# Patient Record
Sex: Male | Born: 1965 | Race: White | Hispanic: No | Marital: Married | Smoking: Never smoker
Health system: Southern US, Community
[De-identification: ages and names within clinical notes are randomized; demographics above are authoritative.]

## PROBLEM LIST (undated history)

## (undated) DIAGNOSIS — E785 Hyperlipidemia, unspecified: Secondary | ICD-10-CM

## (undated) DIAGNOSIS — G4730 Sleep apnea, unspecified: Secondary | ICD-10-CM

## (undated) HISTORY — PX: UVULOPALATOPHARYNGOPLASTY: SHX827

## (undated) HISTORY — PX: TONSILLECTOMY: SUR1361

## (undated) HISTORY — DX: Sleep apnea, unspecified: G47.30

## (undated) HISTORY — DX: Hyperlipidemia, unspecified: E78.5

## (undated) HISTORY — PX: TOOTH EXTRACTION: SUR596

---

## 2008-03-16 ENCOUNTER — Ambulatory Visit: Payer: Self-pay | Admitting: Dermatology

## 2010-01-11 ENCOUNTER — Emergency Department: Payer: Self-pay | Admitting: Emergency Medicine

## 2010-10-05 ENCOUNTER — Ambulatory Visit: Payer: Self-pay | Admitting: Otolaryngology

## 2011-08-09 DIAGNOSIS — R7989 Other specified abnormal findings of blood chemistry: Secondary | ICD-10-CM | POA: Insufficient documentation

## 2011-08-09 DIAGNOSIS — G4700 Insomnia, unspecified: Secondary | ICD-10-CM | POA: Insufficient documentation

## 2012-03-10 ENCOUNTER — Ambulatory Visit: Payer: Self-pay | Admitting: Otolaryngology

## 2012-10-03 DIAGNOSIS — G4733 Obstructive sleep apnea (adult) (pediatric): Secondary | ICD-10-CM | POA: Insufficient documentation

## 2014-06-11 NOTE — Op Note (Signed)
PATIENT NAME:  Chad MessingDWARDS, Malcolm B MR#:  [id number] DATE OF BIRTH:  18-Sep-1965  DATE OF PROCEDURE:  03/10/2012  PREOPERATIVE DIAGNOSES: 1. Airway obstruction.  2. Turbinate hypertrophy.  3. Tonsillar hypertrophy.  4. Left scalp lesion.   POSTOPERATIVE DIAGNOSES: 1. Airway obstruction.  2. Turbinate hypertrophy.  3. Tonsillar hypertrophy.  4. Left scalp lesion.   OPERATIVE PROCEDURE:  1. Tonsillectomy.  2. Bilateral partial reduction of the inferior turbinates.  3. Excision of left scalp lesion, 2 x 3 cm, with advancement flap closure.   SURGEON: Vernie MurdersPaul Shakeena Kafer, MD   ANESTHESIA: General.   COMPLICATIONS: None.   DESCRIPTION OF PROCEDURE: The patient was given general anesthesia by oral endotracheal intubation. A Davis mouth gag was used to visualize the oropharynx. The tonsils were quite large, but they were buried behind large anterior tonsillar pillars. The palate was long with the uvula enlarged. The tonsils were grasped and pulled medially, the anterior pillar incised. The left one looked like it had previous abscess in it because it was all scarred down inferiorly. Once the tonsils were removed, bleeding was controlled with electrocautery. The posterior tonsillar pillars were hypertrophied, and incision was made vertical into the palate and posterior tonsillar pillar. This was then rotated laterally and sutured to the anterior tonsillar pillar to help close the superior half of the tonsillar fossa. This stretched the palate and helped hold it forward as well. This was done on both sides to anchor it there with several mattress sutures bilaterally. The remaining uvula was trimmed some of its inferior border. There was good opening into the nasopharynx and plenty of soft tissue I could elevate posteriorly and close the nasopharynx.   The nose was prepped using 5 mL of 1% Xylocaine with epinephrine 1:200,000 to infiltrate into the inferior turbinates.  The septum was relatively straight.  The turbinates were cauterized along their anterior-inferior border and were trimmed using Gruenwald forceps of some of the redundant tissue and bone. Bleeding was controlled with electrocautery along the trimmed edge. The inferior turbinates were out-fractured, the remaining portion of them, to lateralize now and have more room to sit at the floor of the nose laterally. This opened up the airway on both sides, and I could see the nasopharynx better from here. Bleeding was controlled with electrocautery. No packing was placed.   The left scalp was prepared by prepping with Betadine, and 5 mL of 1% Xylocaine with epinephrine 1:200,000 were used for infiltration in the scalp as well. This was allowed to sit for over 10 minutes. An elliptical incision was created around the lesion without shaving any hair. An ellipse of tissue that was 3 cm in length and about 2 cm in width was removed. This was a raised lesion of the scalp. The full thickness of skin and subcutaneous was removed down to the galea. Bleeding was controlled with electrocautery. The skin and subcutaneous was elevated on both sides in the subgaleal plane. This allowed mobility of both flaps. The flaps were elevated and advanced to the midline, and 3-0 Vicryls were used, interrupted sutures, for pulling together the deep tissues, taking the tension off the wound edge. The wound was then closed using 5-0 Prolene in a running locking suture. This was washed, and Neosporin ointment was placed over the top of it. The patient tolerated the procedure well. He was awakened and taken to the recovery room in satisfactory condition. There were no operative complications.   ____________________________ Cammy CopaPaul H. Aiyanna Awtrey, MD phj:cb D: 03/10/2012 12:26:51 ET  T: 03/10/2012 12:53:53 ET JOB#: [id number]  cc: Cammy Copa, MD, <Dictator> Cammy Copa MD ELECTRONICALLY SIGNED 03/17/2012 7:27

## 2014-07-03 ENCOUNTER — Emergency Department
Admission: EM | Admit: 2014-07-03 | Discharge: 2014-07-03 | Disposition: A | Discharge status: Home / Self Care | Payer: BC Managed Care – PPO | Attending: Emergency Medicine | Admitting: Emergency Medicine

## 2014-07-03 DIAGNOSIS — Y9389 Activity, other specified: Secondary | ICD-10-CM | POA: Insufficient documentation

## 2014-07-03 DIAGNOSIS — Y9289 Other specified places as the place of occurrence of the external cause: Secondary | ICD-10-CM | POA: Diagnosis not present

## 2014-07-03 DIAGNOSIS — X58XXXA Exposure to other specified factors, initial encounter: Secondary | ICD-10-CM | POA: Insufficient documentation

## 2014-07-03 DIAGNOSIS — L500 Allergic urticaria: Secondary | ICD-10-CM | POA: Insufficient documentation

## 2014-07-03 DIAGNOSIS — R220 Localized swelling, mass and lump, head: Secondary | ICD-10-CM | POA: Insufficient documentation

## 2014-07-03 DIAGNOSIS — T50905A Adverse effect of unspecified drugs, medicaments and biological substances, initial encounter: Secondary | ICD-10-CM

## 2014-07-03 DIAGNOSIS — Y998 Other external cause status: Secondary | ICD-10-CM | POA: Diagnosis not present

## 2014-07-03 DIAGNOSIS — T7840XA Allergy, unspecified, initial encounter: Secondary | ICD-10-CM | POA: Diagnosis present

## 2014-07-03 DIAGNOSIS — R202 Paresthesia of skin: Secondary | ICD-10-CM | POA: Insufficient documentation

## 2014-07-03 DIAGNOSIS — T450X5A Adverse effect of antiallergic and antiemetic drugs, initial encounter: Secondary | ICD-10-CM | POA: Insufficient documentation

## 2014-07-03 MED ORDER — DIPHENHYDRAMINE HCL 50 MG/ML IJ SOLN
INTRAMUSCULAR | Status: AC
  Administered 2014-07-03: 25 mg via INTRAVENOUS
  Filled 2014-07-03: qty 1

## 2014-07-03 MED ORDER — METHYLPREDNISOLONE SODIUM SUCC 125 MG IJ SOLR
125.0000 mg | Freq: Once | INTRAMUSCULAR | Status: AC
  Administered 2014-07-03: 125 mg via INTRAVENOUS

## 2014-07-03 MED ORDER — PREDNISONE 20 MG PO TABS
ORAL_TABLET | ORAL | Status: AC
  Administered 2014-07-03: 40 mg via ORAL
  Filled 2014-07-03: qty 2

## 2014-07-03 MED ORDER — PREDNISONE 20 MG PO TABS: 40.0000 mg | ORAL_TABLET | Freq: Every day | ORAL | Status: DC

## 2014-07-03 MED ORDER — FAMOTIDINE IN NACL 20-0.9 MG/50ML-% IV SOLN
20.0000 mg | Freq: Once | INTRAVENOUS | Status: AC
  Administered 2014-07-03: 20 mg via INTRAVENOUS

## 2014-07-03 MED ORDER — DIPHENHYDRAMINE HCL 50 MG/ML IJ SOLN
25.0000 mg | Freq: Once | INTRAMUSCULAR | Status: AC
  Administered 2014-07-03: 25 mg via INTRAVENOUS

## 2014-07-03 MED ORDER — EPINEPHRINE 0.3 MG/0.3ML IJ SOAJ: 0.3000 mg | Freq: Once | INTRAMUSCULAR | Status: AC

## 2014-07-03 MED ORDER — FAMOTIDINE IN NACL 20-0.9 MG/50ML-% IV SOLN
INTRAVENOUS | Status: AC
  Administered 2014-07-03: 20 mg via INTRAVENOUS
  Filled 2014-07-03: qty 50

## 2014-07-03 MED ORDER — SODIUM CHLORIDE 0.9 % IV BOLUS (SEPSIS)
1000.0000 mL | Freq: Once | INTRAVENOUS | Status: AC
  Administered 2014-07-03: 1000 mL via INTRAVENOUS

## 2014-07-03 MED ORDER — METHYLPREDNISOLONE SODIUM SUCC 125 MG IJ SOLR
INTRAMUSCULAR | Status: AC
  Administered 2014-07-03: 125 mg via INTRAVENOUS
  Filled 2014-07-03: qty 2

## 2014-07-03 MED ORDER — PREDNISONE 20 MG PO TABS
40.0000 mg | ORAL_TABLET | Freq: Once | ORAL | Status: AC
Start: 2014-07-03 — End: 2014-07-03
  Administered 2014-07-03: 40 mg via ORAL

## 2014-07-03 NOTE — ED Notes (Signed)
Pt reports playing ball on the ballfield with his kids when he began to feel his lips tingle. Pt went home, took shower, and took 50 mg benadryl PO. When pt got out of the shower he felt as though his tongue was swelling. Upon arrival pt has diffuse hives, pt reports his upper lip feels "thick" Pt speaks full sentences, has no wheezing, can handle his own secretions, and has no visible swelling of his tongue.

## 2014-07-03 NOTE — Discharge Instructions (Signed)
Angioedema °Angioedema is a sudden swelling of tissues, often of the skin. It can occur on the face or genitals or in the abdomen or other body parts. The swelling usually develops over a short period and gets better in 24 to 48 hours. It often begins during the night and is found when the person wakes up. The person may also get red, itchy patches of skin (hives). Angioedema can be dangerous if it involves swelling of the air passages.  °Depending on the cause, episodes of angioedema may only happen once, come back in unpredictable patterns, or repeat for several years and then gradually fade away.  °CAUSES  °Angioedema can be caused by an allergic reaction to various triggers. It can also result from nonallergic causes, including reactions to drugs, immune system disorders, viral infections, or an abnormal gene that is passed to you from your parents (hereditary). For some people with angioedema, the cause is unknown.  °Some things that can trigger angioedema include:  °· Foods.   °· Medicines, such as ACE inhibitors, ARBs, nonsteroidal anti-inflammatory agents, or estrogen.   °· Latex.   °· Animal saliva.   °· Insect stings.   °· Dyes used in X-rays.   °· Mild injury.   °· Dental work. °· Surgery. °· Stress.   °· Sudden changes in temperature.   °· Exercise. °SIGNS AND SYMPTOMS  °· Swelling of the skin. °· Hives. If these are present, there is also intense itching. °· Redness in the affected area.   °· Pain in the affected area. °· Swollen lips or tongue. °· Breathing problems. This may happen if the air passages swell. °· Wheezing. °If internal organs are involved, there may be:  °· Nausea.   °· Abdominal pain.   °· Vomiting.   °· Difficulty swallowing.   °· Difficulty passing urine. °DIAGNOSIS  °· Your health care provider will examine the affected area and take a medical and family history. °· Various tests may be done to help determine the cause. Tests may include: °¨ Allergy skin tests to see if the problem  is an allergic reaction.   °¨ Blood tests to check for hereditary angioedema.   °¨ Tests to check for underlying diseases that could cause the condition.   °· A review of your medicines, including over-the-counter medicines, may be done. °TREATMENT  °Treatment will depend on the cause of the angioedema. Possible treatments include:  °· Removal of anything that triggered the condition (such as stopping certain medicines).   °· Medicines to treat symptoms or prevent attacks. Medicines given may include:   °¨ Antihistamines.   °¨ Epinephrine injection.   °¨ Steroids.   °· Hospitalization may be required for severe attacks. If the air passages are affected, it can be an emergency. Tubes may need to be placed to keep the airway open. °HOME CARE INSTRUCTIONS  °· Take all medicines as directed by your health care provider. °· If you were given medicines for emergency allergy treatment, always carry them with you. °· Wear a medical bracelet as directed by your health care provider.   °· Avoid known triggers. °SEEK MEDICAL CARE IF:  °· You have repeat attacks of angioedema.   °· Your attacks are more frequent or more severe despite preventive measures.   °· You have hereditary angioedema and are considering having children. It is important to discuss with your health care provider the risks of passing the condition on to your children. °SEEK IMMEDIATE MEDICAL CARE IF:  °· You have severe swelling of the mouth, tongue, or lips. °· You have difficulty breathing.   °· You have difficulty swallowing.   °· You faint. °MAKE   SURE YOU: °· Understand these instructions. °· Will watch your condition. °· Will get help right away if you are not doing well or get worse. °Document Released: 04/16/2001 Document Revised: 06/22/2013 Document Reviewed: 09/29/2012 °Exit[employer]® Patient Information ©2015 [employer]. This information is not intended to replace advice given to you by your health care provider. Make sure you discuss any questions  you have with your health care provider. ° °

## 2014-07-03 NOTE — ED Provider Notes (Signed)
----------------------------------------- 9:04 PM on 07/03/2014 -----------------------------------------  [facility] Emergency Department Provider Note  ____________________________________________  Time seen: Approximately 845 PM  I have reviewed the triage vital signs and the nursing notes.   HISTORY  Chief Complaint Allergic Reaction    HPI Chad Santana is a 49 y.o. male who noticed sudden onset of a tingling sensation in his upper lip while playing ball with his children approximately 2 hours ago. He then started to itch over his arms and upper chest and noticed he is developing hives. He went home took a shower and took 50 mg of Benadryl by mouth.  He then noticed that it felt like his tongue was swelling and his lips began to swell so he drove to the ER. At the present time he states his symptoms are slowly improving, his tongue swelling has improved, he no longer feels swelling in the throat, and his lips are less swollen.  Patient has no history of allergies and is unable to identify any cause. He denies being stung by any insects, he denies ingesting any new foods, and denies any other exposures.  No pain. No wheezing. No shortness of breath.  Denies medical problems. No known drug allergies No medications other than Benadryl taken at home No past medical history on file.  There are no active problems to display for this patient.   No past surgical history on file.  Current Outpatient Rx  Name  Route  Sig  Dispense  Refill  . EPINEPHrine 0.3 mg/0.3 mL IJ SOAJ injection   Intramuscular   Inject 0.3 mLs (0.3 mg total) into the muscle once.   2 Device   0   . predniSONE (DELTASONE) 20 MG tablet   Oral   Take 2 tablets (40 mg total) by mouth daily with breakfast.   10 tablet   0     Allergies Review of patient's allergies indicates no known allergies.  No family history on file.  Social History History  Substance Use  Topics  . Smoking status: Not on file  . Smokeless tobacco: Not on file  . Alcohol Use: Not on file    Review of Systems Constitutional: No fever/chills Eyes: No visual changes. ENT: No sore throat. Cardiovascular: Denies chest pain. Respiratory: Denies shortness of breath. Gastrointestinal: No abdominal pain.  No nausea, no vomiting.  No diarrhea.  No constipation. Genitourinary: Negative for dysuria. Musculoskeletal: Negative for back pain. Skin: See history of present illness Neurological: Negative for headaches, focal weakness or numbness.  10-point ROS otherwise negative.  ____________________________________________   PHYSICAL EXAM:  VITAL SIGNS: ED Triage Vitals  Enc Vitals Group     BP 07/03/14 2047 116/79 mmHg     Pulse Rate 07/03/14 2047 72     Resp 07/03/14 2047 18     Temp 07/03/14 2047 98.2 F (36.8 C)     Temp Source 07/03/14 2047 Oral     SpO2 07/03/14 2047 94 %     Weight --      Height --      Head Cir --      Peak Flow --      Pain Score --      Pain Loc --      Pain Edu? --      Excl. in GC? --     Constitutional: Alert and oriented. Well appearing and in no acute distress. Eyes: Conjunctivae are normal. PERRL. EOMI. Head: Atraumatic. Nose: No congestion/rhinnorhea. Mouth/Throat: Mucous membranes  are moist.  Oropharynx non-erythematous. There is mild edema of the lips, but there is no evidence of lingular or pharyngeal edema. There is no stridor. There is no anterior neck swelling. There are occasional urticaria on the upper arms and across the upper chest, which the patient reports are starting to improve. Neck: No stridor.   Cardiovascular: Normal rate, regular rhythm. Grossly normal heart sounds.  Good peripheral circulation. Respiratory: Normal respiratory effort.  No retractions. Lungs CTAB. Gastrointestinal: Soft and nontender. No distention. No abdominal bruits. No CVA tenderness. Musculoskeletal: No lower extremity tenderness nor edema.   No joint effusions. Neurologic:  Normal speech and language. No gross focal neurologic deficits are appreciated. Speech is normal. No gait instability. Skin:  Skin is warm, dry and intact. Rash noted as described above Psychiatric: Mood and affect are normal. Speech and behavior are normal.  ____________________________________________   LABS (all labs ordered are listed, but only abnormal results are displayed)  Labs Reviewed - No data to display ____________________________________________  EKG   ____________________________________________  RADIOLOGY   ____________________________________________   PROCEDURES  Procedure(s) performed: None  Critical Care performed: No  ____________________________________________   INITIAL IMPRESSION / ASSESSMENT AND PLAN / ED COURSE  Pertinent labs & imaging results that were available during my care of the patient were reviewed by me and considered in my medical decision making (see chart for details).  Patient condition consistent with a mild to moderate allergic reaction. He is not hypotensive. His symptoms are improving after Benadryl at home. Given the extent of urticaria and lip swelling which did occur, we will initiate treatment with Solu-Medrol, Pepcid, and an additional 25 mg Benadryl.  We will observe the patient closely in the emergency department for improvement. If he continues to improve, I anticipate discharge to home. We did discuss prescription for an EpiPen and indications in use. Patient will continue on prednisone as well as Benadryl for the next 4 days.  He can follow-up with his primary care physician, and I also advised that in a couple of weeks he may wish to follow up with an allergist for further identification of cause. ____________________________________________ ----------------------------------------- 10:09 PM on 07/03/2014 -----------------------------------------   On reevaluation patient states  his hives have gone away, and indeed they have. He is feeling improved. He has just very minimal swelling of his lips and none of his tongue at this time. Appears his reaction is steadily improving. I discussed with the patient and will give him a first dose of prednisone here as pharmacies are closing. In addition I will give him a prescription for an epinephrine pen. He'll continue Benadryl for the next 2 days, I did discuss this may make him drowsy. Patient and his wife appear very reliable and amiable, they will follow up with his primary care physician and get a referral to an allergist.  I discussed close return precautions as well as indications for epinephrine use should his symptoms severely worsen, though I doubt they will at this point.  FINAL CLINICAL IMPRESSION(S) / ED DIAGNOSES  Final diagnoses:  Urticaria due to drug allergy      Sharyn CreamerMark Quale, MD 07/03/14 2338

## 2014-12-07 ENCOUNTER — Ambulatory Visit: Payer: BC Managed Care – PPO | Attending: Otolaryngology

## 2015-11-30 ENCOUNTER — Other Ambulatory Visit: Payer: Self-pay | Admitting: Sports Medicine

## 2015-11-30 DIAGNOSIS — M542 Cervicalgia: Secondary | ICD-10-CM

## 2015-12-16 ENCOUNTER — Other Ambulatory Visit: Payer: BC Managed Care – PPO

## 2015-12-28 ENCOUNTER — Ambulatory Visit
Admission: RE | Admit: 2015-12-28 | Discharge: 2015-12-28 | Disposition: A | Discharge status: Home / Self Care | Payer: 59 | Source: Ambulatory Visit | Attending: Sports Medicine | Admitting: Sports Medicine

## 2015-12-28 DIAGNOSIS — M542 Cervicalgia: Secondary | ICD-10-CM

## 2016-04-04 ENCOUNTER — Ambulatory Visit: Payer: 59

## 2016-04-04 ENCOUNTER — Other Ambulatory Visit: Payer: Self-pay | Admitting: Family Medicine

## 2016-04-04 DIAGNOSIS — R1031 Right lower quadrant pain: Secondary | ICD-10-CM

## 2016-04-05 ENCOUNTER — Ambulatory Visit
Admission: RE | Admit: 2016-04-05 | Discharge: 2016-04-05 | Disposition: A | Discharge status: Home / Self Care | Payer: Commercial Managed Care - HMO | Source: Ambulatory Visit | Attending: Family Medicine | Admitting: Family Medicine

## 2016-04-05 DIAGNOSIS — K5641 Fecal impaction: Secondary | ICD-10-CM | POA: Insufficient documentation

## 2016-04-05 DIAGNOSIS — R1031 Right lower quadrant pain: Secondary | ICD-10-CM | POA: Insufficient documentation

## 2016-04-05 DIAGNOSIS — K869 Disease of pancreas, unspecified: Secondary | ICD-10-CM | POA: Diagnosis not present

## 2016-04-05 DIAGNOSIS — K760 Fatty (change of) liver, not elsewhere classified: Secondary | ICD-10-CM | POA: Insufficient documentation

## 2016-04-05 MED ORDER — IOPAMIDOL (ISOVUE-300) INJECTION 61%
150.0000 mL | Freq: Once | INTRAVENOUS | Status: AC | PRN
  Administered 2016-04-05: 125 mL via INTRAVENOUS

## 2016-04-10 ENCOUNTER — Other Ambulatory Visit: Payer: Self-pay | Admitting: Family Medicine

## 2016-04-10 DIAGNOSIS — K862 Cyst of pancreas: Secondary | ICD-10-CM

## 2016-04-20 ENCOUNTER — Ambulatory Visit
Admission: RE | Admit: 2016-04-20 | Discharge: 2016-04-20 | Disposition: A | Discharge status: Home / Self Care | Payer: Commercial Managed Care - HMO | Source: Ambulatory Visit | Attending: Family Medicine | Admitting: Family Medicine

## 2016-04-20 DIAGNOSIS — K429 Umbilical hernia without obstruction or gangrene: Secondary | ICD-10-CM | POA: Diagnosis not present

## 2016-04-20 DIAGNOSIS — K760 Fatty (change of) liver, not elsewhere classified: Secondary | ICD-10-CM | POA: Insufficient documentation

## 2016-04-20 DIAGNOSIS — K862 Cyst of pancreas: Secondary | ICD-10-CM | POA: Insufficient documentation

## 2016-04-20 MED ORDER — GADOBENATE DIMEGLUMINE 529 MG/ML IV SOLN
20.0000 mL | Freq: Once | INTRAVENOUS | Status: AC | PRN
  Administered 2016-04-20: 20 mL via INTRAVENOUS

## 2016-05-14 DIAGNOSIS — K862 Cyst of pancreas: Secondary | ICD-10-CM | POA: Insufficient documentation

## 2016-05-15 ENCOUNTER — Ambulatory Visit: Payer: Self-pay

## 2016-06-13 ENCOUNTER — Ambulatory Visit: Payer: Self-pay | Admitting: Urology

## 2016-06-13 ENCOUNTER — Encounter: Payer: Self-pay | Admitting: Urology

## 2017-06-22 ENCOUNTER — Encounter: Payer: Self-pay | Admitting: Emergency Medicine

## 2017-06-22 ENCOUNTER — Emergency Department
Admission: EM | Admit: 2017-06-22 | Discharge: 2017-06-22 | Disposition: A | Discharge status: Home / Self Care | Payer: 59 | Attending: Emergency Medicine | Admitting: Emergency Medicine

## 2017-06-22 ENCOUNTER — Other Ambulatory Visit: Payer: Self-pay

## 2017-06-22 DIAGNOSIS — Y92828 Other wilderness area as the place of occurrence of the external cause: Secondary | ICD-10-CM | POA: Diagnosis not present

## 2017-06-22 DIAGNOSIS — W458XXA Other foreign body or object entering through skin, initial encounter: Secondary | ICD-10-CM | POA: Diagnosis not present

## 2017-06-22 DIAGNOSIS — S61042A Puncture wound with foreign body of left thumb without damage to nail, initial encounter: Secondary | ICD-10-CM | POA: Diagnosis not present

## 2017-06-22 DIAGNOSIS — Y999 Unspecified external cause status: Secondary | ICD-10-CM | POA: Diagnosis not present

## 2017-06-22 DIAGNOSIS — S6992XA Unspecified injury of left wrist, hand and finger(s), initial encounter: Secondary | ICD-10-CM

## 2017-06-22 DIAGNOSIS — Y9389 Activity, other specified: Secondary | ICD-10-CM | POA: Diagnosis not present

## 2017-06-22 MED ORDER — LIDOCAINE HCL (PF) 1 % IJ SOLN
10.0000 mL | Freq: Once | INTRAMUSCULAR | Status: AC
  Administered 2017-06-22: 10 mL
  Filled 2017-06-22: qty 10

## 2017-06-22 MED ORDER — TETANUS-DIPHTH-ACELL PERTUSSIS 5-2.5-18.5 LF-MCG/0.5 IM SUSP
0.5000 mL | Freq: Once | INTRAMUSCULAR | Status: AC
  Administered 2017-06-22: 0.5 mL via INTRAMUSCULAR
  Filled 2017-06-22: qty 0.5

## 2017-06-22 MED ORDER — AMOXICILLIN-POT CLAVULANATE 875-125 MG PO TABS: 1.0000 | ORAL_TABLET | Freq: Two times a day (BID) | ORAL | 0 refills | Status: DC

## 2017-06-22 MED ORDER — LIDOCAINE HCL (PF) 1 % IJ SOLN
INTRAMUSCULAR | Status: AC
  Administered 2017-06-22: 10 mL
  Filled 2017-06-22: qty 10

## 2017-06-22 MED ORDER — HYDROCODONE-ACETAMINOPHEN 5-325 MG PO TABS
1.0000 | ORAL_TABLET | Freq: Once | ORAL | Status: AC
  Administered 2017-06-22: 1 via ORAL
  Filled 2017-06-22: qty 1

## 2017-06-22 MED ORDER — AMOXICILLIN-POT CLAVULANATE 875-125 MG PO TABS
1.0000 | ORAL_TABLET | Freq: Once | ORAL | Status: AC
  Administered 2017-06-22: 1 via ORAL
  Filled 2017-06-22: qty 1

## 2017-06-22 NOTE — ED Triage Notes (Signed)
Fishhook in L thumb x 1 hour.

## 2017-06-22 NOTE — ED Provider Notes (Signed)
[facility] Emergency Department Provider Note  ____________________________________________  Time seen: Approximately 5:49 PM  I have reviewed the triage vital signs and the nursing notes.   HISTORY  Chief Complaint Foreign Body in Skin    HPI Chad Santana is a 52 y.o. male who presents emergency department complaining of a fishhook embedded in his left digit.  Patient reports he was fishing, went to extract a hook out of the fish's mouth when the hook caught him in the thumb.  Patient reports that there is a single barbed hook embedded in the proximal thumb.  Patient attempted to remove at the time of injury but was unsuccessful.  Patient has good extension and flexion of his digit.  Patient denies any other injury or complaint.  His last tetanus shot was 8 years ago.  No other complaints at this time.  History reviewed. No pertinent past medical history.  There are no active problems to display for this patient.   History reviewed. No pertinent surgical history.  Prior to Admission medications   Medication Sig Start Date End Date Taking? Authorizing Provider  amoxicillin-clavulanate (AUGMENTIN) 875-125 MG tablet Take 1 tablet by mouth 2 (two) times daily. 06/22/17   Cuthriell, Delorise Royals, PA-C  EPINEPHrine 0.3 mg/0.3 mL IJ SOAJ injection Inject 0.3 mLs (0.3 mg total) into the muscle once. 07/03/14   Sharyn Creamer, MD  predniSONE (DELTASONE) 20 MG tablet Take 2 tablets (40 mg total) by mouth daily with breakfast. 07/03/14   Sharyn Creamer, MD    Allergies Patient has no known allergies.  No family history on file.  Social History Social History   Tobacco Use  . Smoking status: Not on file  Substance Use Topics  . Alcohol use: Not on file  . Drug use: Not on file     Review of Systems  Constitutional: No fever/chills Eyes: No visual changes.  Cardiovascular: no chest pain. Respiratory: no cough. No SOB. Gastrointestinal: No abdominal pain.   No nausea, no vomiting.   Musculoskeletal: Embedded fishhook left thumb. Skin: Negative for rash, abrasions, lacerations, ecchymosis. Neurological: Negative for headaches, focal weakness or numbness. 10-point ROS otherwise negative.  ____________________________________________   PHYSICAL EXAM:  VITAL SIGNS: ED Triage Vitals [06/22/17 1711]  Enc Vitals Group     BP (!) 140/93     Pulse Rate 75     Resp 20     Temp 98 F (36.7 C)     Temp Source Oral     SpO2 95 %     Weight 249 lb (112.9 kg)     Height  (1.803 m)     Head Circumference      Peak Flow      Pain Score 10     Pain Loc      Pain Edu?      Excl. in GC?      Constitutional: Alert and oriented. Well appearing and in no acute distress. Eyes: Conjunctivae are normal. PERRL. EOMI. Head: Atraumatic. Neck: No stridor.    Cardiovascular: Normal rate, regular rhythm. Normal S1 and S2.  Good peripheral circulation. Respiratory: Normal respiratory effort without tachypnea or retractions. Lungs CTAB. Good air entry to the bases with no decreased or absent breath sounds. Musculoskeletal: Full range of motion to all extremities. No gross deformities appreciated.  Visualization of the left thumb reveals embedded fishhook to the dorsal aspect, proximal aspect of the thumb.  Patient has good extension and flexion of the digit.  Manipulation  of the fishhook reveals that barbed end is relatively superficial.  No bleeding.  No other physical exam findings to the left hand. Neurologic:  Normal speech and language. No gross focal neurologic deficits are appreciated.  Skin:  Skin is warm, dry and intact. No rash noted. Psychiatric: Mood and affect are normal. Speech and behavior are normal. Patient exhibits appropriate insight and judgement.   ____________________________________________   LABS (all labs ordered are listed, but only abnormal results are displayed)  Labs Reviewed - No data to  display ____________________________________________  EKG   ____________________________________________  RADIOLOGY   No results found.  ____________________________________________    PROCEDURES  Procedure(s) performed:    .Foreign Body Removal Date/Time: 06/22/2017 6:59 PM Performed by: Racheal Patches, PA-C Authorized by: Racheal Patches, PA-C  Consent: Verbal consent obtained. Risks and benefits: risks, benefits and alternatives were discussed Consent given by: patient Patient understanding: patient states understanding of the procedure being performed Patient identity confirmed: verbally with patient Body area: skin General location: upper extremity Location details: left thumb Anesthesia: digital block  Anesthesia: Local Anesthetic: lidocaine 1% without epinephrine Anesthetic total: 12 mL  Sedation: Patient sedated: no  Patient restrained: no Patient cooperative: yes Localization method: visualized Removal mechanism: hemostat (ring cutter) Dressing: dressing applied Tendon involvement: none Depth: deep Complexity: simple 1 objects recovered. Objects recovered: fishhook Post-procedure assessment: foreign body removed Patient tolerance: Patient tolerated the procedure well with no immediate complications Comments: Digit was anesthetized using digital block.  Patient tolerated well.  After block was successful, fishhook was advanced until barbed and had infiltrated skin, this is removed using electric ring cutter.  Hook is been retracted without the barb and successfully removed.  Wound is thoroughly cleansed both prior to and status post removal.  Sensation, cap refill, movement is intact both prior to and status post procedure.      Medications  HYDROcodone-acetaminophen (NORCO/VICODIN) 5-325 MG per tablet 1 tablet (has no administration in time range)  lidocaine (PF) (XYLOCAINE) 1 % injection 10 mL (10 mLs Infiltration Given by Other  06/22/17 1841)  Tdap (BOOSTRIX) injection 0.5 mL (0.5 mLs Intramuscular Given 06/22/17 1840)  amoxicillin-clavulanate (AUGMENTIN) 875-125 MG per tablet 1 tablet (1 tablet Oral Given 06/22/17 1840)     ____________________________________________   INITIAL IMPRESSION / ASSESSMENT AND PLAN / ED COURSE  Pertinent labs & imaging results that were available during my care of the patient were reviewed by me and considered in my medical decision making (see chart for details).  Review of the [place] CSRS was performed in accordance of the [facility] prior to dispensing any controlled drugs.     Patient's diagnosis is consistent with embedded fishhook to left thumb.  Patient presents with injury/embedded fishhook to the left thumb.  Patient's thumb is anesthetized, fishhook successfully extracted as described above in the procedure note.  Patient tolerated well with no complications.  Wound care instructions are provided to patient.  Tetanus shot is updated today.  Patient will be placed on antibiotics prophylactically.  Tylenol Motrin at home as needed for pain..  Patient will follow primary care as needed.  Patient is given ED precautions to return to the ED for any worsening or new symptoms.     ____________________________________________  FINAL CLINICAL IMPRESSION(S) / ED DIAGNOSES  Final diagnoses:  Fish hook injury of finger of left hand, initial encounter      NEW MEDICATIONS STARTED DURING THIS VISIT:  ED Discharge Orders        Ordered  amoxicillin-clavulanate (AUGMENTIN) 875-125 MG tablet  2 times daily     06/22/17 1902          This chart was dictated using voice recognition software/Dragon. Despite best efforts to proofread, errors can occur which can change the meaning. Any change was purely unintentional.    Racheal Patches, PA-C 06/22/17 1911    Emily Filbert, MD 06/22/17 [phone]

## 2018-01-06 ENCOUNTER — Encounter: Payer: Self-pay | Admitting: Urology

## 2018-01-06 ENCOUNTER — Ambulatory Visit: Payer: 59 | Admitting: Urology

## 2018-01-06 ENCOUNTER — Other Ambulatory Visit: Payer: Self-pay

## 2018-01-06 VITALS — BP 134/87 | HR 84 | Wt 258.0 lb

## 2018-01-06 DIAGNOSIS — I10 Essential (primary) hypertension: Secondary | ICD-10-CM | POA: Insufficient documentation

## 2018-01-06 DIAGNOSIS — E291 Testicular hypofunction: Secondary | ICD-10-CM

## 2018-01-06 DIAGNOSIS — E785 Hyperlipidemia, unspecified: Secondary | ICD-10-CM | POA: Insufficient documentation

## 2018-01-06 DIAGNOSIS — F419 Anxiety disorder, unspecified: Secondary | ICD-10-CM | POA: Insufficient documentation

## 2018-01-07 ENCOUNTER — Encounter: Payer: Self-pay | Admitting: Urology

## 2018-01-07 LAB — LUTEINIZING HORMONE: LH: 3.3 m[IU]/mL (ref 1.7–8.6)

## 2018-01-07 LAB — PROLACTIN: PROLACTIN: 13.8 ng/mL (ref 4.0–15.2)

## 2018-01-07 NOTE — Progress Notes (Signed)
01/06/2018 6:48 AM   Chad Santana [birth year] [id number]  Referring provider: Dione Housekeeper, MD [address] [place], [place] [address]  Chief Complaint  Patient presents with  . Hypogonadism    HPI: 52 year old male seen in consultation at the request of Dr. Zada Finders for evaluation of hypogonadism.  His symptoms include significant fatigue with decreased energy, decreased libido and depressed mood.  He has occasional erectile dysfunction.  Denies bothersome lower urinary tract symptoms.  He has nocturia x2.  He gives a history of anabolic steroid use approximately 20 years ago but nothing recent.  Denies prior history of urologic problems or prior urologic evaluation.  Chart review remarkable for the following total testosterone levels:  02/2011  157 02/2016  142 10/2017  186  He has not had pituitary hormone testing.   PMH: Past Medical History:  Diagnosis Date  . Hyperlipidemia   . Sleep apnea     Surgical History: Past Surgical History:  Procedure Laterality Date  . TONSILLECTOMY    . TOOTH EXTRACTION    . UVULOPALATOPHARYNGOPLASTY      Home Medications:  Allergies as of 01/06/2018   No Known Allergies     Medication List        Accurate as of 01/06/18 11:59 PM. Always use your most recent med list.          atorvastatin 40 MG tablet Commonly known as:  LIPITOR Take 40 mg by mouth daily.   EPINEPHrine 0.3 mg/0.3 mL Soaj injection Commonly known as:  EPI-PEN Inject 0.3 mLs (0.3 mg total) into the muscle once.   venlafaxine 75 MG tablet Commonly known as:  EFFEXOR Take 75 mg by mouth daily.       Allergies: No Known Allergies  Family History: Family History  Problem Relation Age of Onset  . Mesothelioma Mother   . Diabetes Mother   . Congestive Heart Failure Father   . Kidney disease Father   . Arthritis Father     Social History:  reports that he has never smoked. He has never used smokeless tobacco. He reports that he  drank alcohol. He reports that he has current or past drug history.  ROS: UROLOGY Frequent Urination?: Yes Hard to postpone urination?: No Burning/pain with urination?: No Get up at night to urinate?: Yes Leakage of urine?: No Urine stream starts and stops?: Yes Trouble starting stream?: No Do you have to strain to urinate?: No Blood in urine?: No Urinary tract infection?: No Sexually transmitted disease?: No Injury to kidneys or bladder?: No Painful intercourse?: No Weak stream?: No Erection problems?: No Penile pain?: No  Gastrointestinal Nausea?: No Vomiting?: No Indigestion/heartburn?: No Diarrhea?: No Constipation?: No  Constitutional Fever: No Night sweats?: No Weight loss?: No Fatigue?: No  Skin Skin rash/lesions?: No Itching?: No  Eyes Blurred vision?: No Double vision?: No  Ears/Nose/Throat Sore throat?: No Sinus problems?: No  Hematologic/Lymphatic Swollen glands?: No Easy bruising?: No  Cardiovascular Leg swelling?: No Chest pain?: No  Respiratory Cough?: No Shortness of breath?: No  Endocrine Excessive thirst?: No  Musculoskeletal Back pain?: No Joint pain?: No  Neurological Headaches?: No Dizziness?: No  Psychologic Depression?: No Anxiety?: No  Physical Exam: BP 134/87   Pulse 84   Wt 258 lb (117 kg)   BMI 35.98 kg/m   Constitutional:  Alert and oriented, No acute distress. HEENT: [place] AT, moist mucus membranes.  Trachea midline, no masses. Cardiovascular: No clubbing, cyanosis, or edema. Respiratory: Normal respiratory effort, no increased  work of breathing. GI: Abdomen is soft, nontender, nondistended, no abdominal masses GU: No CVA tenderness.  Phallus circumcised without lesions, testes descended bilaterally without masses or tenderness with estimated volume approximately 20 cc bilaterally.  No paratesticular abnormalities. Lymph: No cervical or inguinal lymphadenopathy. Skin: No rashes, bruises or suspicious  lesions. Neurologic: Grossly intact, no focal deficits, moving all 4 extremities. Psychiatric: Normal mood and affect.   Assessment & Plan:   52 year old male with symptomatic hypogonadism.  LH and prolactin were ordered.  If no significant abnormalities he desires to start TRT.  Delivery options were discussed including topicals and injections.  The off label use of Clomid was also discussed if his LH level was low normal.  He is leaning towards IM injections.  Potential side effects of testosterone replacement were discussed including stimulation of benign prostatic growth with lower urinary tract symptoms; erythrocytosis; edema; gynecomastia; worsening sleep apnea; venous thromboembolism; testicular atrophy and infertility. Recent studies suggesting an increased incidence of heart attack and stroke in patients taking testosterone was discussed. He was informed there is conflicting evidence regarding the impact of testosterone therapy on cardiovascular risk. The theoretical risk of growth stimulation of an undetected prostate cancer was also discussed.  He was informed that current evidence does not provide any definitive answers regarding the risks of testosterone therapy on prostate cancer and cardiovascular disease. The need for periodic monitoring of his testosterone level, PSA, hematocrit and DRE was discussed.   Riki AltesScott C , MD  [facility] [address], [address] [place], [place] [address] [phone]

## 2018-01-08 ENCOUNTER — Encounter: Payer: Self-pay | Admitting: Urology

## 2018-01-08 ENCOUNTER — Telehealth: Payer: Self-pay

## 2018-01-08 NOTE — Telephone Encounter (Signed)
-----   Message from Riki AltesScott C Stoioff, MD sent at 01/07/2018 11:12 PM EST ----- Pituitary hormones showed no suggestion of a pituitary tumor.  His LH was low normal.  Options for testosterone replacement were discussed yesterday.  He was leaning towards injections.  With his LH been low normal he would be a candidate for oral Clomid if he desired to try this first.

## 2018-01-08 NOTE — Telephone Encounter (Signed)
Called to inform pt of results, unable to leave detailed message, no DPR on file. Left general vm for callback.

## 2018-01-09 ENCOUNTER — Other Ambulatory Visit: Payer: Self-pay | Admitting: Urology

## 2018-01-09 DIAGNOSIS — E291 Testicular hypofunction: Secondary | ICD-10-CM

## 2018-01-09 MED ORDER — CLOMIPHENE CITRATE 50 MG PO TABS: 25.0000 mg | ORAL_TABLET | Freq: Every day | ORAL | 0 refills | Status: AC

## 2018-02-20 ENCOUNTER — Other Ambulatory Visit: Payer: 59

## 2018-02-20 ENCOUNTER — Encounter: Payer: Self-pay | Admitting: Urology

## 2019-01-05 ENCOUNTER — Other Ambulatory Visit: Payer: Self-pay | Admitting: Urology

## 2019-01-05 DIAGNOSIS — E291 Testicular hypofunction: Secondary | ICD-10-CM

## 2019-02-25 ENCOUNTER — Other Ambulatory Visit: Payer: 59

## 2019-06-05 ENCOUNTER — Ambulatory Visit: Payer: 59 | Attending: Internal Medicine

## 2019-06-05 DIAGNOSIS — Z23 Encounter for immunization: Secondary | ICD-10-CM

## 2019-06-05 NOTE — Progress Notes (Signed)
   Covid-19 Vaccination Clinic  Name:  Chad Santana    MRN: [id number] DOB: Jun 12, 1965  06/05/2019  Mr. Chad Santana was observed post Covid-19 immunization for 15 minutes without incident. He was provided with Vaccine Information Sheet and instruction to access the V-Safe system.   Mr. Chad Santana was instructed to call 911 with any severe reactions post vaccine: Marland Kitchen Difficulty breathing  . Swelling of face and throat  . A fast heartbeat  . A bad rash all over body  . Dizziness and weakness   Immunizations Administered    Name Date Dose VIS Date Route   Pfizer COVID-19 Vaccine 06/05/2019  8:59 AM 0.3 mL 01/30/2019 Intramuscular   Manufacturer: [employer], [employer]   Lot: [id number]   NDC: [address]-8

## 2019-06-06 ENCOUNTER — Ambulatory Visit: Payer: 59

## 2019-06-30 ENCOUNTER — Ambulatory Visit: Payer: 59 | Attending: Internal Medicine

## 2019-09-02 LAB — EXTERNAL GENERIC LAB PROCEDURE: COLOGUARD: POSITIVE — AB

## 2019-09-02 LAB — COLOGUARD: COLOGUARD: POSITIVE — AB

## 2019-12-15 ENCOUNTER — Other Ambulatory Visit: Payer: Self-pay | Admitting: Family Medicine

## 2019-12-15 DIAGNOSIS — K862 Cyst of pancreas: Secondary | ICD-10-CM

## 2020-01-04 ENCOUNTER — Ambulatory Visit
Admission: RE | Admit: 2020-01-04 | Discharge: 2020-01-04 | Disposition: A | Discharge status: Home / Self Care | Payer: 59 | Source: Ambulatory Visit | Attending: Family Medicine | Admitting: Family Medicine

## 2020-01-04 ENCOUNTER — Other Ambulatory Visit: Payer: Self-pay

## 2020-01-04 DIAGNOSIS — K862 Cyst of pancreas: Secondary | ICD-10-CM | POA: Insufficient documentation

## 2020-01-04 MED ORDER — GADOBUTROL 1 MMOL/ML IV SOLN
10.0000 mL | Freq: Once | INTRAVENOUS | Status: AC | PRN
  Administered 2020-01-04: 10 mL via INTRAVENOUS

## 2020-03-23 ENCOUNTER — Telehealth: Payer: Self-pay | Admitting: *Deleted

## 2020-03-23 DIAGNOSIS — R079 Chest pain, unspecified: Secondary | ICD-10-CM

## 2020-03-23 NOTE — Telephone Encounter (Signed)
CT Dept got back with me.  Pts Provider, Dr. Denton Ar MD is requesting this order on the pt.  Results will need to be cc'ed to him to review once complete.

## 2020-03-23 NOTE — Telephone Encounter (Signed)
CT dept sent a staff message to triage to place an order under our Reader Dr. Delton See, for this pt to have a Cardiac Calcium Score to be done here at our office. Pt was referred for this test by [facility], for chest pain. Order placed and CT Scheduler to follow-up with the pt on making this appt.

## 2020-03-23 NOTE — Telephone Encounter (Signed)
-----   Message from Fabiola Backer, [place] sent at 03/23/2020 10:49 AM EST ----- Regarding: RE: Outside Order Needs Placed CT Cardiac Scoring Diagnosis Chest Pain with high risk of acute coronary syndrome (R07.9)   I'm sorry. Never done this before so I'm still learning what you guys need....   ----- Message ----- From: Loa Socks, LPN Sent: 02/22/2765  10:39 AM EST To: Fabiola Backer, RT Subject: FW: Outside Order Needs Placed                 I'm sorry, but what kind of order do you need?  We also need diagnosis codes.  Thanks, Marijose Curington triage  ----- Message ----- From: Fabiola Backer, RT Sent: 03/23/2020  10:16 AM EST To: Cv Div Ch St Triage Subject: Outside Order Needs Placed                     Hi, We got this from [facility]. I scanned in the document. We just need an order placed. Thanks! Benna Dunks

## 2020-04-21 ENCOUNTER — Inpatient Hospital Stay: Admission: RE | Admit: 2020-04-21 | Payer: 59 | Source: Ambulatory Visit

## 2020-05-11 ENCOUNTER — Inpatient Hospital Stay: Admission: RE | Admit: 2020-05-11 | Payer: Self-pay | Source: Ambulatory Visit

## 2020-05-12 ENCOUNTER — Inpatient Hospital Stay: Admission: RE | Admit: 2020-05-12 | Payer: Self-pay | Source: Ambulatory Visit

## 2020-05-12 NOTE — Addendum Note (Signed)
Addended by: Loa Socks on: 05/12/2020 01:42 PM   Modules accepted: Orders

## 2020-05-12 NOTE — Telephone Encounter (Signed)
New order for Cardiac Calcium Score placed on this pt, under Dr. Flora Lipps at [facility] office, due to Dr. Delton See leaving.  CT to reschedule pt and link new order.

## 2020-06-10 ENCOUNTER — Inpatient Hospital Stay: Admission: RE | Admit: 2020-06-10 | Payer: Self-pay | Source: Ambulatory Visit

## 2020-07-20 ENCOUNTER — Inpatient Hospital Stay: Admission: RE | Admit: 2020-07-20 | Payer: Self-pay | Source: Ambulatory Visit

## 2022-07-03 IMAGING — MR MR ABDOMEN WO/W CM
17 of 19 series · 46 of 48 positions shown · IV contrast (10ml Gadavist)
Comparison: Abdominal MRI 04/20/2016. CT the abdomen and pelvis
04/05/2016.

CLINICAL DATA: 54-year-old male with history of 2.7 cm cystic
lesion in the pancreatic head. Follow-up study.

EXAM:
MRI ABDOMEN WITHOUT AND WITH CONTRAST
TECHNIQUE: Multiplanar multisequence MR imaging of the abdomen was performed
both before and after the administration of intravenous contrast.
CONTRAST:  10mL GADAVIST GADOBUTROL 1 MMOL/ML IV SOLN

[Series 2: cor haste · coronal · 6.0mm · 1.25mm/px · 1 of 36 slices shown]
[im 1/36]
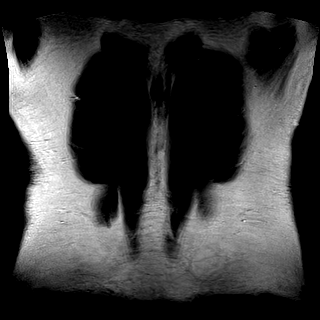

[Series 3: ax haste · axial · 6.0mm · 1.25mm/px · 1 of 38 slices shown]
[im 1/38]
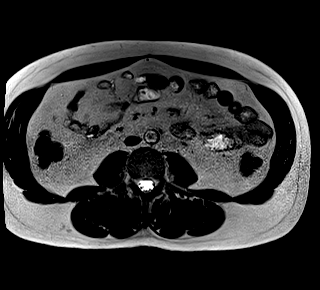

[Series 6: T2 fat-sat · axial · 6.0mm · 1.25mm/px · 1 of 38 slices shown]
[im 1/38]
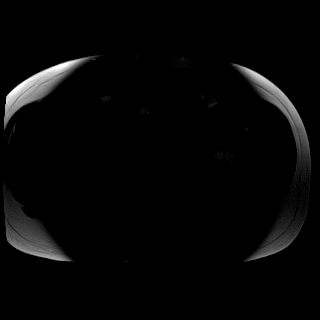

[Series 8: ax dwi_adc · axial · 6.0mm · 1.42mm/px · 1 of 38 slices shown]
[im 1/38]
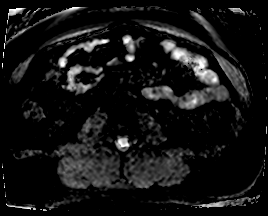

[Series 9: ax in & · axial · 3.5mm · 1.25mm/px · z∈[-134,+142]mm · 7 of 160 slices shown]
[im 1/160]
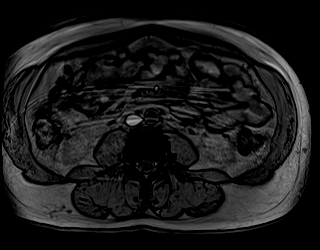
[im 27/160]
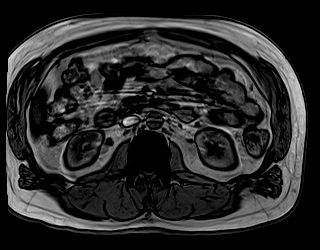
[im 54/160]
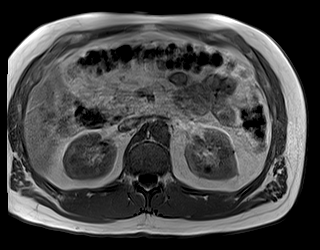
[im 80/160]
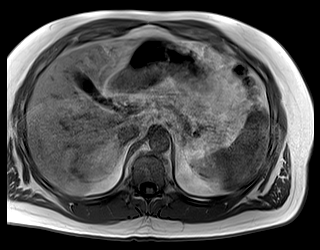
[im 107/160]
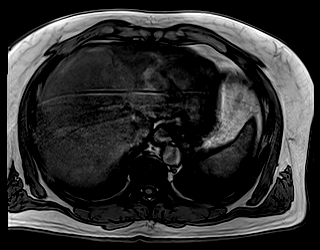
[im 133/160]
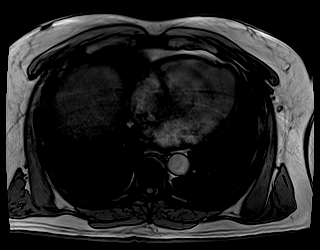
[im 160/160]
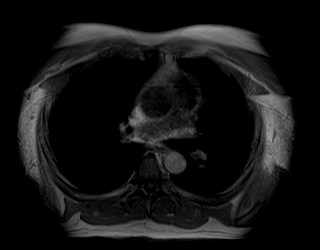

[Series 13: bSSFP · axial · 6.0mm · 0.78mm/px · z∈[-129,+137]mm · 2 of 38 slices shown]
[im 1/38]
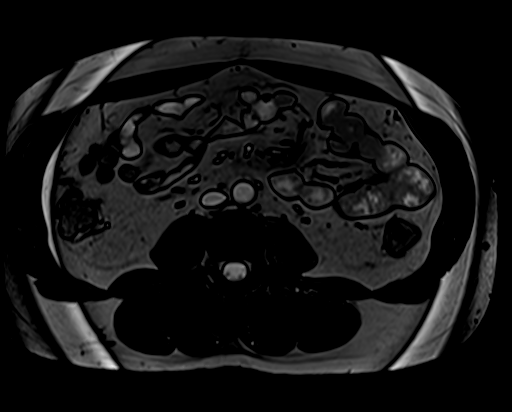
[im 38/38]
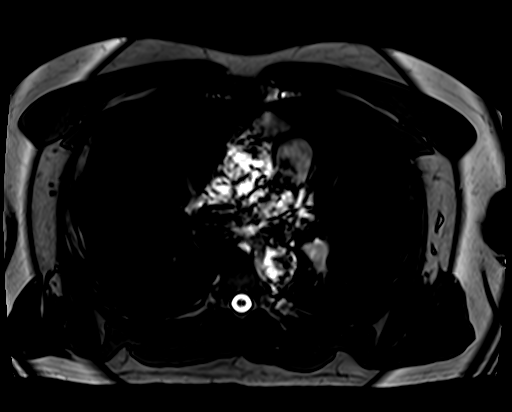

[Series 16: T1 dynamic fat-sat · axial · non-contrast · 4.0mm · 1.25mm/px · z∈[-138,+146]mm · 3 of 72 slices shown (1 of 5)]
[im 1/72]
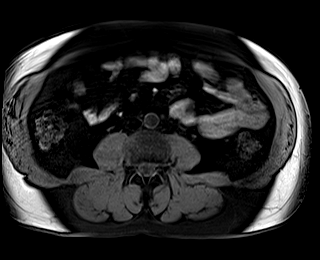
[im 36/72]
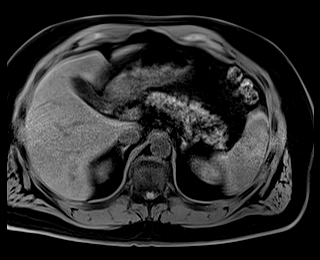
[im 72/72]
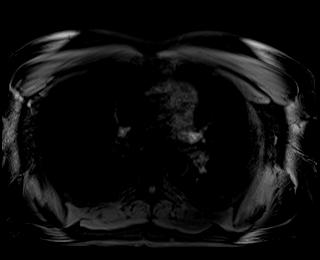

[Series 17: T1 dynamic fat-sat post-contrast · axial · 4.0mm · 1.25mm/px · z∈[-138,+146]mm · 3 of 72 slices shown (1 of 4)]
[im 1/72]
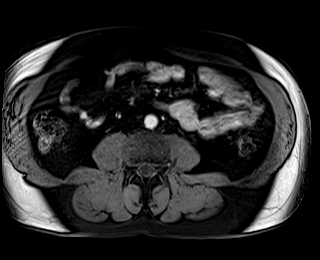
[im 36/72]
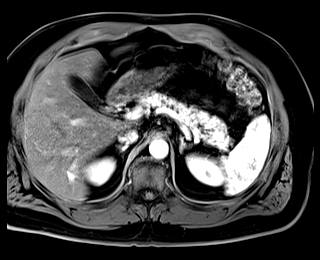
[im 72/72]
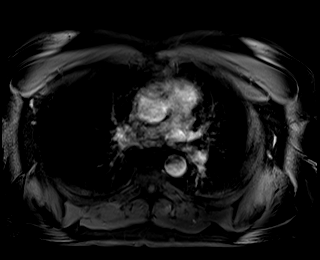

[Series 18: T1 dynamic fat-sat · axial · 4.0mm · 1.25mm/px · z∈[-138,+146]mm · 3 of 72 slices shown (2 of 5)]
[im 1/72]
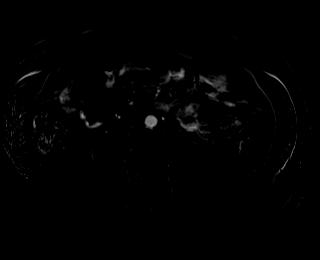
[im 36/72]
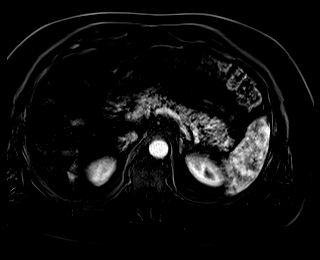
[im 72/72]
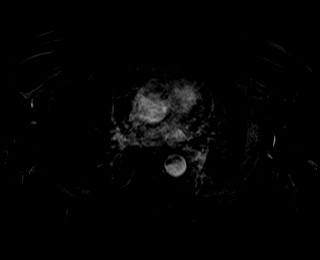

[Series 19: T1 dynamic fat-sat post-contrast · axial · 4.0mm · 1.25mm/px · z∈[-138,+146]mm · 3 of 72 slices shown (2 of 4)]
[im 1/72]
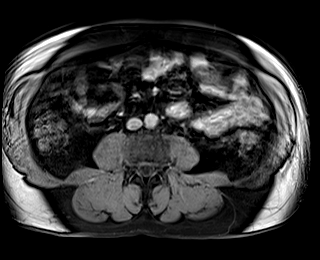
[im 36/72]
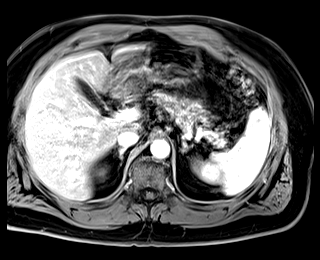
[im 72/72]
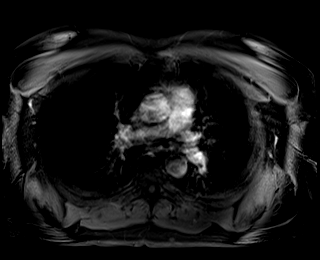

[Series 20: T1 dynamic fat-sat · axial · 4.0mm · 1.25mm/px · z∈[-138,+146]mm · 3 of 72 slices shown (3 of 5)]
[im 1/72]
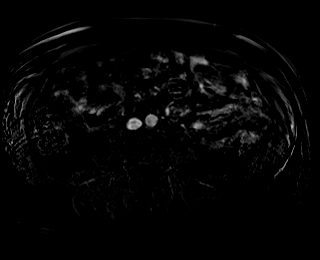
[im 36/72]
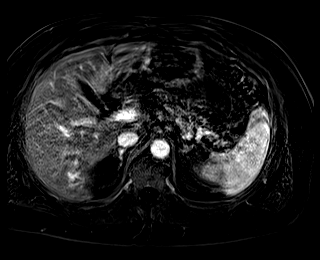
[im 72/72]
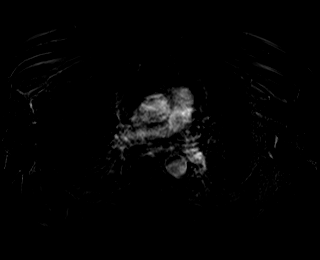

[Series 21: T1 dynamic fat-sat post-contrast · axial · 4.0mm · 1.25mm/px · z∈[-138,+146]mm · 3 of 72 slices shown (3 of 4)]
[im 1/72]
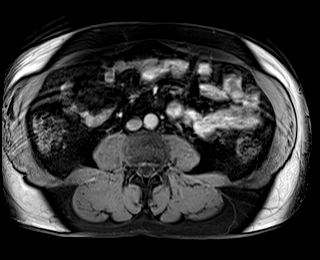
[im 36/72]
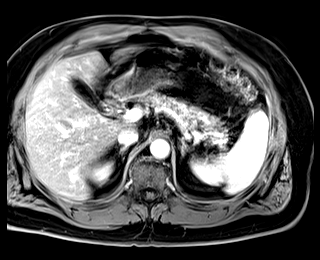
[im 72/72]
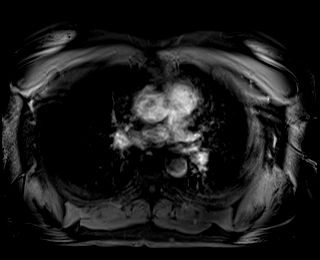

[Series 22: T1 dynamic fat-sat · axial · 4.0mm · 1.25mm/px · z∈[-138,+146]mm · 3 of 72 slices shown (4 of 5)]
[im 1/72]
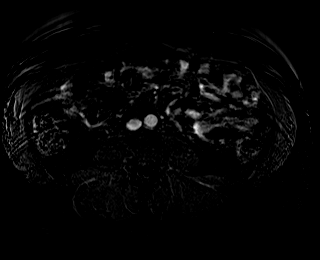
[im 36/72]
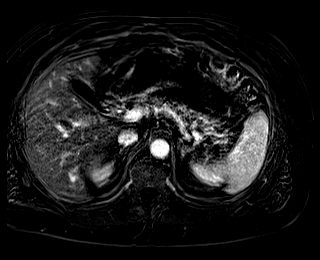
[im 72/72]
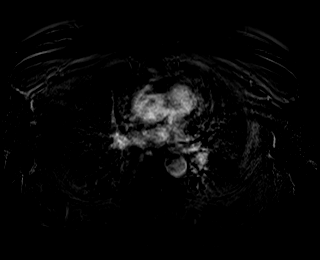

[Series 23: T1 dynamic post-contrast · coronal · 4.0mm · 1.25mm/px · 3 of 64 slices shown (1 of 2)]
[im 1/64]
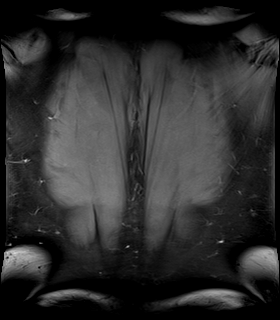
[im 32/64]
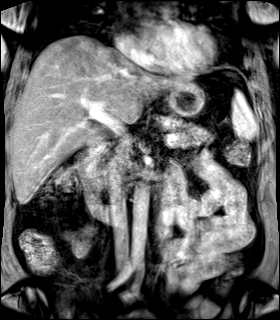
[im 64/64]
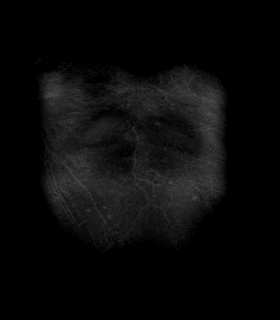

[Series 24: T1 dynamic fat-sat post-contrast · axial · 4.0mm · 1.25mm/px · z∈[-138,+146]mm · 3 of 72 slices shown (4 of 4)]
[im 1/72]
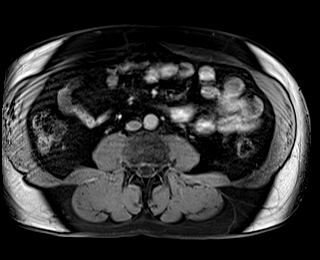
[im 36/72]
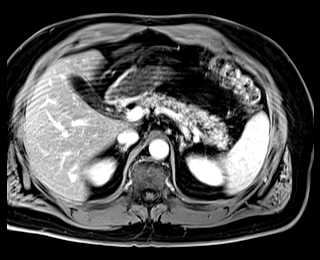
[im 72/72]
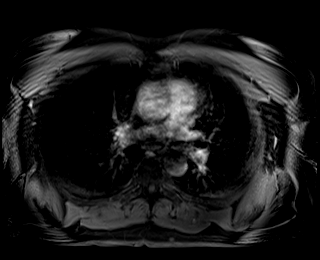

[Series 25: T1 dynamic fat-sat · axial · 4.0mm · 1.25mm/px · z∈[-138,+146]mm · 3 of 72 slices shown (5 of 5)]
[im 1/72]
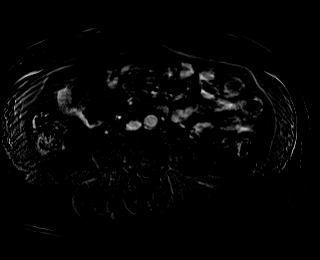
[im 36/72]
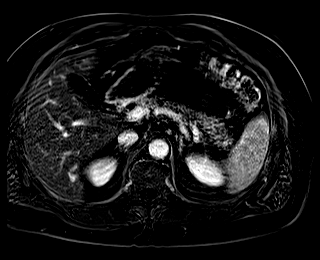
[im 72/72]
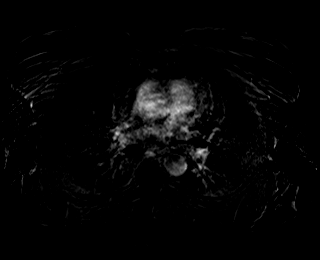

[Series 26: T1 dynamic post-contrast · coronal · 4.0mm · 1.25mm/px · 3 of 64 slices shown (2 of 2)]
[im 1/64]
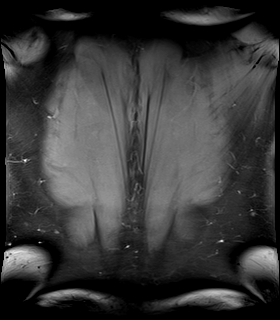
[im 32/64]
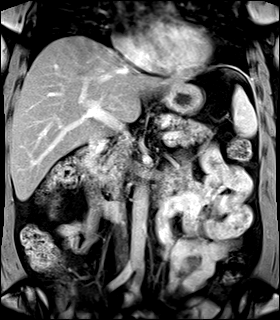
[im 64/64]
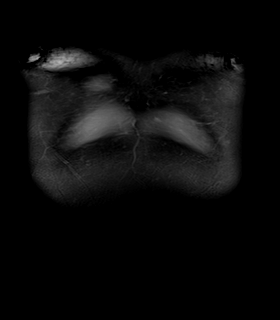

[46 of 48 positions shown; findings below may reference images not displayed]

FINDINGS: Comment: Today's study is again mildly limited by considerable
patient respiratory motion.

Lower chest: Unremarkable.

Hepatobiliary: Diffuse loss of signal intensity throughout the
hepatic parenchyma on out of phase dual echo images, indicative of a
background of hepatic steatosis. In segment 6 of the liver (axial
image 36 of series 17) there is a 1.0 x 0.9 cm lesion which is
hypointense on T1 weighted images, mildly hyperintense on T2
weighted images, demonstrates arterial phase hyperenhancement and
persistence of enhancement which Rejpal is that of blood pool on
delayed post gadolinium imaging, similar in retrospect to the prior
examination, most compatible with a small flash fill cavernous
hemangioma. No other suspicious appearing hepatic lesions are noted.
No intra or extrahepatic biliary ductal dilatation. Gallbladder is
unremarkable in appearance.

Pancreas: The previously noted cystic lesion in the pancreatic head
has significantly regressed compared to the prior examination.
Today's study demonstrates only a small residual 8 x 7 mm T2
hyperintense lesion in the pancreatic head (axial image 24 of series
6) which is not well appreciated on other pulse sequences secondary
to motion (specifically, on post gadolinium images, this lesion is
not well demonstrated). No other pancreatic mass. No pancreatic
ductal dilatation. No peripancreatic fluid collections or
inflammatory changes.

Spleen:  Unremarkable.

Adrenals/Urinary Tract: Subcentimeter T1 hypointense, T2
hyperintense, nonenhancing lesions in both kidneys are compatible
with tiny simple cysts. No suspicious renal lesions. No
hydroureteronephrosis in the visualized portions of the abdomen.
Bilateral adrenal glands are normal in appearance.

Stomach/Bowel: Visualized portions are unremarkable.

Vascular/Lymphatic: No aneurysm identified in the visualized
abdominal vasculature. No lymphadenopathy noted in the abdomen.

Other: No significant volume of ascites noted in the visualized
portions of the peritoneal cavity.

Musculoskeletal: No aggressive appearing osseous lesions are noted
in the visualized portions of the skeleton.
IMPRESSION: 1. The lesion of concern in the head of the pancreas has nearly
completely regressed compared to the prior examination. Although
this is poorly evaluated on today's motion limited examination, this
is most indicative of a benign process, likely an involuting
pancreatic pseudocyst.
2. Severe hepatic steatosis.
3. Small flash fill cavernous hemangioma in segment 6 of the liver
incidentally noted.

## 2023-06-11 ENCOUNTER — Other Ambulatory Visit: Payer: Self-pay | Admitting: Cardiology

## 2023-06-11 DIAGNOSIS — R079 Chest pain, unspecified: Secondary | ICD-10-CM

## 2023-06-11 DIAGNOSIS — Z8249 Family history of ischemic heart disease and other diseases of the circulatory system: Secondary | ICD-10-CM

## 2023-06-11 DIAGNOSIS — E119 Type 2 diabetes mellitus without complications: Secondary | ICD-10-CM

## 2023-06-11 DIAGNOSIS — E782 Mixed hyperlipidemia: Secondary | ICD-10-CM

## 2023-06-19 ENCOUNTER — Telehealth (HOSPITAL_COMMUNITY): Payer: Self-pay | Admitting: *Deleted

## 2023-06-19 MED ORDER — METOPROLOL TARTRATE 50 MG PO TABS: ORAL_TABLET | ORAL | 0 refills | Status: AC

## 2023-06-19 NOTE — Telephone Encounter (Signed)
 Reaching out to patient to offer assistance regarding upcoming cardiac imaging study; pt verbalizes understanding of appt date/time, parking situation and where to check in, pre-test NPO status and medications ordered, and verified current allergies; name and call back number provided for further questions should they arise Johney Frame RN Navigator Cardiac Imaging [facility] [phone] office [phone] cell

## 2023-06-19 NOTE — Telephone Encounter (Signed)
 Attempted to call patient regarding upcoming cardiac CT appointment. Left message on voicemail with name and callback number Johney Frame RN Navigator Cardiac Imaging [facility] [phone] Office

## 2023-06-19 NOTE — Addendum Note (Signed)
 Addended by: Kerri Peed A on: 06/19/2023 03:36 PM   Modules accepted: Orders

## 2023-06-20 ENCOUNTER — Ambulatory Visit
Admission: RE | Admit: 2023-06-20 | Discharge: 2023-06-20 | Disposition: A | Discharge status: Home / Self Care | Source: Ambulatory Visit | Attending: Cardiology | Admitting: Cardiology

## 2023-06-20 DIAGNOSIS — K2289 Other specified disease of esophagus: Secondary | ICD-10-CM | POA: Diagnosis not present

## 2023-06-20 DIAGNOSIS — Z8249 Family history of ischemic heart disease and other diseases of the circulatory system: Secondary | ICD-10-CM | POA: Diagnosis present

## 2023-06-20 DIAGNOSIS — E119 Type 2 diabetes mellitus without complications: Secondary | ICD-10-CM | POA: Diagnosis present

## 2023-06-20 DIAGNOSIS — I2510 Atherosclerotic heart disease of native coronary artery without angina pectoris: Secondary | ICD-10-CM | POA: Insufficient documentation

## 2023-06-20 DIAGNOSIS — R079 Chest pain, unspecified: Secondary | ICD-10-CM | POA: Insufficient documentation

## 2023-06-20 DIAGNOSIS — E782 Mixed hyperlipidemia: Secondary | ICD-10-CM | POA: Diagnosis present

## 2023-06-20 LAB — POCT I-STAT CREATININE: Creatinine, Ser: 1 mg/dL (ref 0.61–1.24)

## 2023-06-20 MED ORDER — NITROGLYCERIN 0.4 MG SL SUBL
0.8000 mg | SUBLINGUAL_TABLET | Freq: Once | SUBLINGUAL | Status: AC
  Administered 2023-06-20: 0.8 mg via SUBLINGUAL
  Filled 2023-06-20: qty 25

## 2023-06-20 MED ORDER — IOHEXOL 350 MG/ML SOLN
80.0000 mL | Freq: Once | INTRAVENOUS | Status: AC | PRN
  Administered 2023-06-20: 80 mL via INTRAVENOUS

## 2023-06-20 NOTE — Progress Notes (Signed)
Pt tolerated procedure well with no issues. Pt ABCs intact. Pt denies any complaints. Pt encouraged to drink plenty of water throughout the day. Pt ambulatory with steady gait.

## 2024-01-01 ENCOUNTER — Other Ambulatory Visit: Payer: Self-pay | Admitting: Sports Medicine

## 2024-01-01 DIAGNOSIS — M47816 Spondylosis without myelopathy or radiculopathy, lumbar region: Secondary | ICD-10-CM

## 2024-01-01 DIAGNOSIS — M1612 Unilateral primary osteoarthritis, left hip: Secondary | ICD-10-CM

## 2024-01-01 DIAGNOSIS — M1990 Unspecified osteoarthritis, unspecified site: Secondary | ICD-10-CM

## 2024-01-01 DIAGNOSIS — M2550 Pain in unspecified joint: Secondary | ICD-10-CM

## 2024-01-01 DIAGNOSIS — M25552 Pain in left hip: Secondary | ICD-10-CM

## 2024-01-01 DIAGNOSIS — G8929 Other chronic pain: Secondary | ICD-10-CM

## 2024-01-09 ENCOUNTER — Ambulatory Visit
Admission: RE | Admit: 2024-01-09 | Discharge: 2024-01-09 | Disposition: A | Discharge status: Home / Self Care | Source: Ambulatory Visit | Attending: Sports Medicine | Admitting: Sports Medicine

## 2024-01-09 DIAGNOSIS — M25552 Pain in left hip: Secondary | ICD-10-CM

## 2024-01-09 DIAGNOSIS — M545 Low back pain, unspecified: Secondary | ICD-10-CM

## 2024-01-09 DIAGNOSIS — M2550 Pain in unspecified joint: Secondary | ICD-10-CM

## 2024-01-09 DIAGNOSIS — M47816 Spondylosis without myelopathy or radiculopathy, lumbar region: Secondary | ICD-10-CM

## 2024-01-09 DIAGNOSIS — M1990 Unspecified osteoarthritis, unspecified site: Secondary | ICD-10-CM

## 2024-01-09 DIAGNOSIS — M1612 Unilateral primary osteoarthritis, left hip: Secondary | ICD-10-CM
